# Patient Record
Sex: Female | Born: 1975 | Race: Black or African American | Hispanic: No | Marital: Single | Smoking: Never smoker
Health system: Southern US, Community
[De-identification: ages and names within clinical notes are randomized; demographics above are authoritative.]

## PROBLEM LIST (undated history)

## (undated) DIAGNOSIS — J45909 Unspecified asthma, uncomplicated: Secondary | ICD-10-CM

## (undated) HISTORY — PX: BREAST SURGERY: SHX581

---

## 2008-06-15 ENCOUNTER — Emergency Department (HOSPITAL_COMMUNITY): Admission: EM | Admit: 2008-06-15 | Discharge: 2008-06-15 | Payer: Self-pay | Admitting: Emergency Medicine

## 2011-04-19 ENCOUNTER — Emergency Department (HOSPITAL_COMMUNITY): Payer: Self-pay

## 2011-04-19 ENCOUNTER — Emergency Department (HOSPITAL_COMMUNITY)
Admission: EM | Admit: 2011-04-19 | Discharge: 2011-04-19 | Disposition: A | Discharge status: Home / Self Care | Payer: Self-pay | Attending: Emergency Medicine | Admitting: Emergency Medicine

## 2011-04-19 ENCOUNTER — Encounter (HOSPITAL_COMMUNITY): Payer: Self-pay

## 2011-04-19 DIAGNOSIS — R109 Unspecified abdominal pain: Secondary | ICD-10-CM | POA: Insufficient documentation

## 2011-04-19 DIAGNOSIS — M549 Dorsalgia, unspecified: Secondary | ICD-10-CM | POA: Insufficient documentation

## 2011-04-19 LAB — CBC
HCT: 39.7 % (ref 36.0–46.0)
Hemoglobin: 13.1 g/dL (ref 12.0–15.0)
MCHC: 33 g/dL (ref 30.0–36.0)
RBC: 4.23 MIL/uL (ref 3.87–5.11)

## 2011-04-19 LAB — COMPREHENSIVE METABOLIC PANEL
ALT: 8 U/L (ref 0–35)
AST: 18 U/L (ref 0–37)
Alkaline Phosphatase: 46 U/L (ref 39–117)
CO2: 25 mEq/L (ref 19–32)
Calcium: 9.9 mg/dL (ref 8.4–10.5)
Chloride: 103 mEq/L (ref 96–112)
GFR calc Af Amer: >60 mL/min (ref >60)
GFR calc non Af Amer: >60 mL/min (ref >60)
Glucose, Bld: 78 mg/dL (ref 70–99)
Potassium: 3.4 mEq/L — ABNORMAL LOW (ref 3.5–5.1)
Sodium: 139 mEq/L (ref 135–145)
Total Bilirubin: 0.4 mg/dL (ref 0.3–1.2)

## 2011-04-19 LAB — URINALYSIS, ROUTINE W REFLEX MICROSCOPIC
Bilirubin Urine: NEGATIVE
Nitrite: NEGATIVE
Urobilinogen, UA: 0.2 mg/dL (ref 0.0–1.0)
pH: 5.5 (ref 5.0–8.0)

## 2011-04-19 LAB — DIFFERENTIAL
Basophils Absolute: 0 10*3/uL (ref 0.0–0.1)
Lymphocytes Relative: 36 % (ref 12–46)
Monocytes Absolute: 0.5 10*3/uL (ref 0.1–1.0)
Neutro Abs: 2.8 10*3/uL (ref 1.7–7.7)
Neutrophils Relative %: 51 % (ref 43–77)

## 2011-04-19 LAB — URINE MICROSCOPIC-ADD ON

## 2011-04-19 LAB — PREGNANCY, URINE: Preg Test, Ur: NEGATIVE

## 2011-04-26 ENCOUNTER — Emergency Department (HOSPITAL_COMMUNITY)
Admission: EM | Admit: 2011-04-26 | Discharge: 2011-04-26 | Disposition: A | Discharge status: Home / Self Care | Payer: Non-veteran care | Attending: Emergency Medicine | Admitting: Emergency Medicine

## 2011-04-26 DIAGNOSIS — N760 Acute vaginitis: Secondary | ICD-10-CM | POA: Insufficient documentation

## 2011-04-26 DIAGNOSIS — B9689 Other specified bacterial agents as the cause of diseases classified elsewhere: Secondary | ICD-10-CM | POA: Insufficient documentation

## 2011-04-26 DIAGNOSIS — N739 Female pelvic inflammatory disease, unspecified: Secondary | ICD-10-CM | POA: Insufficient documentation

## 2011-04-26 DIAGNOSIS — A499 Bacterial infection, unspecified: Secondary | ICD-10-CM | POA: Insufficient documentation

## 2011-04-26 LAB — WET PREP, GENITAL
Trich, Wet Prep: NONE SEEN
Yeast Wet Prep HPF POC: NONE SEEN

## 2011-04-26 LAB — URINALYSIS, ROUTINE W REFLEX MICROSCOPIC
Glucose, UA: NEGATIVE mg/dL
Protein, ur: NEGATIVE mg/dL
Specific Gravity, Urine: 1.031 — ABNORMAL HIGH (ref 1.005–1.030)
pH: 6 (ref 5.0–8.0)

## 2011-04-26 LAB — POCT PREGNANCY, URINE: Preg Test, Ur: NEGATIVE

## 2011-09-05 LAB — RPR: RPR Ser Ql: NONREACTIVE

## 2011-09-05 LAB — URINALYSIS, ROUTINE W REFLEX MICROSCOPIC
Ketones, ur: NEGATIVE
Nitrite: NEGATIVE
pH: 6

## 2011-09-05 LAB — WET PREP, GENITAL: Clue Cells Wet Prep HPF POC: NONE SEEN

## 2011-09-05 LAB — POCT PREGNANCY, URINE: Operator id: 272551

## 2012-11-06 ENCOUNTER — Emergency Department (HOSPITAL_COMMUNITY)
Admission: EM | Admit: 2012-11-06 | Discharge: 2012-11-07 | Disposition: A | Discharge status: Home / Self Care | Payer: Non-veteran care | Attending: Emergency Medicine | Admitting: Emergency Medicine

## 2012-11-06 ENCOUNTER — Encounter (HOSPITAL_COMMUNITY): Payer: Self-pay | Admitting: Emergency Medicine

## 2012-11-06 DIAGNOSIS — N760 Acute vaginitis: Secondary | ICD-10-CM | POA: Insufficient documentation

## 2012-11-06 DIAGNOSIS — B9689 Other specified bacterial agents as the cause of diseases classified elsewhere: Secondary | ICD-10-CM

## 2012-11-06 DIAGNOSIS — Z79899 Other long term (current) drug therapy: Secondary | ICD-10-CM | POA: Insufficient documentation

## 2012-11-06 DIAGNOSIS — J45909 Unspecified asthma, uncomplicated: Secondary | ICD-10-CM | POA: Insufficient documentation

## 2012-11-06 HISTORY — DX: Unspecified asthma, uncomplicated: J45.909

## 2012-11-06 LAB — URINALYSIS, ROUTINE W REFLEX MICROSCOPIC
Bilirubin Urine: NEGATIVE
Hgb urine dipstick: NEGATIVE
Specific Gravity, Urine: 1.016 (ref 1.005–1.030)
Urobilinogen, UA: 0.2 mg/dL (ref 0.0–1.0)
pH: 5.5 (ref 5.0–8.0)

## 2012-11-06 LAB — CBC WITH DIFFERENTIAL/PLATELET
Eosinophils Absolute: 0 10*3/uL (ref 0.0–0.7)
Eosinophils Relative: 1 % (ref 0–5)
HCT: 39 % (ref 36.0–46.0)
Hemoglobin: 13.5 g/dL (ref 12.0–15.0)
Lymphs Abs: 1.2 10*3/uL (ref 0.7–4.0)
MCH: 32.3 pg (ref 26.0–34.0)
MCV: 93.3 fL (ref 78.0–100.0)
Monocytes Absolute: 0.2 10*3/uL (ref 0.1–1.0)
Monocytes Relative: 5 % (ref 3–12)
RBC: 4.18 MIL/uL (ref 3.87–5.11)

## 2012-11-06 LAB — COMPREHENSIVE METABOLIC PANEL
Alkaline Phosphatase: 46 U/L (ref 39–117)
BUN: 12 mg/dL (ref 6–23)
Calcium: 9.4 mg/dL (ref 8.4–10.5)
GFR calc Af Amer: >90 mL/min (ref >90)
GFR calc non Af Amer: 89 mL/min — ABNORMAL LOW (ref >90)
Glucose, Bld: 86 mg/dL (ref 70–99)
Total Protein: 6.7 g/dL (ref 6.0–8.3)

## 2012-11-06 LAB — LIPASE, BLOOD: Lipase: 17 U/L (ref 11–59)

## 2012-11-06 LAB — WET PREP, GENITAL: WBC, Wet Prep HPF POC: NONE SEEN

## 2012-11-06 LAB — POCT PREGNANCY, URINE: Preg Test, Ur: NEGATIVE

## 2012-11-06 MED ORDER — ONDANSETRON HCL 4 MG/2ML IJ SOLN
4.0000 mg | Freq: Once | INTRAMUSCULAR | Status: AC
  Administered 2012-11-06: 4 mg via INTRAVENOUS
  Filled 2012-11-06: qty 2

## 2012-11-06 MED ORDER — HYDROMORPHONE HCL PF 1 MG/ML IJ SOLN
1.0000 mg | Freq: Once | INTRAMUSCULAR | Status: AC
  Administered 2012-11-06: 1 mg via INTRAVENOUS
  Filled 2012-11-06: qty 1

## 2012-11-06 MED ORDER — SODIUM CHLORIDE 0.9 % IV SOLN
1000.0000 mL | INTRAVENOUS | Status: DC
  Administered 2012-11-06: 1000 mL via INTRAVENOUS

## 2012-11-06 NOTE — ED Notes (Signed)
Pt finished with contrast, CT notified. 

## 2012-11-06 NOTE — ED Provider Notes (Signed)
History     CSN: [id number]  Arrival date & time 11/06/12  4098   First MD Initiated Contact with Patient 11/06/12 2122      Chief Complaint  Patient presents with  . Abdominal Pain    (Consider location/radiation/quality/duration/timing/severity/associated sxs/prior treatment) Patient is a 36 y.o. female presenting with abdominal pain. The history is provided by the patient and medical records. No language interpreter was used.  Abdominal Pain The primary symptoms of the illness include abdominal pain. The primary symptoms of the illness do not include fever, nausea, vomiting, diarrhea, dysuria, vaginal discharge or vaginal bleeding. Primary symptoms comment: Patient has had left lower quadrant abdominal pain on and off for over a year. She seen the doctor a couple of times. Review of old charts shows prior diagnoses of bacterial vaginosis. Today the pain became much worse, radiating into her left thigh. The current episode started 6 to 12 hours ago. The onset of the illness was sudden. The problem has been rapidly worsening.  Associated with: Nothing. The patient states that she believes she is currently not pregnant. The patient has not had a change in bowel habit. Risk factors: Prior C-section. Symptoms associated with the illness do not include chills, urgency, hematuria or frequency. Associated medical issues comments: None..    Past Medical History  Diagnosis Date  . Asthma     Past Surgical History  Procedure Date  . Cesarean section   . Breast surgery     No family history on file.  History  Substance Use Topics  . Smoking status: Never Smoker   . Smokeless tobacco: Not on file  . Alcohol Use: Yes    OB History    Grav Para Term Preterm Abortions TAB SAB Ect Mult Living                  Review of Systems  Constitutional: Negative.  Negative for fever and chills.  HENT: Negative.   Eyes: Negative.   Respiratory: Negative.   Cardiovascular: Negative.     Gastrointestinal: Positive for abdominal pain. Negative for nausea, vomiting and diarrhea.  Genitourinary: Negative for dysuria, urgency, frequency, hematuria, vaginal bleeding and vaginal discharge.  Musculoskeletal: Negative.   Skin: Negative.   Neurological: Negative.   Psychiatric/Behavioral: Negative.     Allergies  Review of patient's allergies indicates no known allergies.  Home Medications   Current Outpatient Rx  Name  Route  Sig  Dispense  Refill  . ARIPIPRAZOLE 5 MG PO TABS   Oral   Take 5 mg by mouth daily.         . ASPIRIN 81 MG PO CHEW   Oral   Chew 81 mg by mouth daily.           BP 94/65  Pulse 71  Temp 98.6 F (37 C) (Oral)  Resp 20  SpO2 100%  LMP 10/15/2012  Physical Exam  Nursing note and vitals reviewed. Constitutional: She is oriented to person, place, and time. She appears well-developed and well-nourished.       In moderate distress with LLQ pain.  HENT:  Head: Normocephalic and atraumatic.  Right Ear: External ear normal.  Left Ear: External ear normal.  Mouth/Throat: Oropharynx is clear and moist.  Eyes: Conjunctivae normal and EOM are normal. Pupils are equal, round, and reactive to light.  Neck: Normal range of motion. Neck supple. No thyromegaly present.  Cardiovascular: Normal rate, regular rhythm and normal heart sounds.   Pulmonary/Chest: Effort normal and breath  sounds normal.  Abdominal: Soft.       LLQ tenderness, no rebound, mass or rigidity.  Genitourinary:       Pelvic exam showed normal female external genitalia.  Speculum exam showed minimal gray discharge.  Specimens sent for GC/chlamydia and wet prep.  Bimanual exam showed no uterine or adnexal mass or tenderness.  Musculoskeletal: Normal range of motion. She exhibits no edema and no tenderness.  Lymphadenopathy:    She has no cervical adenopathy.  Neurological: She is alert and oriented to person, place, and time.       No sensory or motor deficit.  Skin: Skin is  warm and dry.  Psychiatric: She has a normal mood and affect. Her behavior is normal.    ED Course  Procedures (including critical care time)  Labs Reviewed  COMPREHENSIVE METABOLIC PANEL - Abnormal; Notable for the following:    GFR calc non Af Amer 89 (*)     All other components within normal limits  CBC WITH DIFFERENTIAL  POCT PREGNANCY, URINE  URINALYSIS, ROUTINE W REFLEX MICROSCOPIC  COMPREHENSIVE METABOLIC PANEL  LIPASE, BLOOD  URINALYSIS, ROUTINE W REFLEX MICROSCOPIC  PREGNANCY, URINE  CBC WITH DIFFERENTIAL  WET PREP, GENITAL  GC/CHLAMYDIA PROBE AMP   9:52 PM Pt seen --> physical exam performed.  Lab workup ordered.  IV fluids, IV medications for pain and nausea ordered.  12:26 AM Results for orders placed during the hospital encounter of 11/06/12  URINALYSIS, ROUTINE W REFLEX MICROSCOPIC      Component Value Range   Color, Urine YELLOW  YELLOW   APPearance CLOUDY (*) CLEAR   Specific Gravity, Urine 1.016  1.005 - 1.030   pH 5.5  5.0 - 8.0   Glucose, UA NEGATIVE  NEGATIVE mg/dL   Hgb urine dipstick NEGATIVE  NEGATIVE   Bilirubin Urine NEGATIVE  NEGATIVE   Ketones, ur NEGATIVE  NEGATIVE mg/dL   Protein, ur NEGATIVE  NEGATIVE mg/dL   Urobilinogen, UA 0.2  0.0 - 1.0 mg/dL   Nitrite NEGATIVE  NEGATIVE   Leukocytes, UA NEGATIVE  NEGATIVE  CBC WITH DIFFERENTIAL      Component Value Range   WBC 4.1  4.0 - 10.5 K/uL   RBC 4.18  3.87 - 5.11 MIL/uL   Hemoglobin 13.5  12.0 - 15.0 g/dL   HCT 16.1  09.6 - 04.5 %   MCV 93.3  78.0 - 100.0 fL   MCH 32.3  26.0 - 34.0 pg   MCHC 34.6  30.0 - 36.0 g/dL   RDW 40.9  81.1 - 91.4 %   Platelets 182  150 - 400 K/uL   Neutrophils Relative 64  43 - 77 %   Neutro Abs 2.7  1.7 - 7.7 K/uL   Lymphocytes Relative 29  12 - 46 %   Lymphs Abs 1.2  0.7 - 4.0 K/uL   Monocytes Relative 5  3 - 12 %   Monocytes Absolute 0.2  0.1 - 1.0 K/uL   Eosinophils Relative 1  0 - 5 %   Eosinophils Absolute 0.0  0.0 - 0.7 K/uL   Basophils Relative 1   0 - 1 %   Basophils Absolute 0.0  0.0 - 0.1 K/uL  COMPREHENSIVE METABOLIC PANEL      Component Value Range   Sodium 136  135 - 145 mEq/L   Potassium 4.3  3.5 - 5.1 mEq/L   Chloride 102  96 - 112 mEq/L   CO2 25  19 - 32 mEq/L  Glucose, Bld 86  70 - 99 mg/dL   BUN 12  6 - 23 mg/dL   Creatinine, Ser 2.95  0.50 - 1.10 mg/dL   Calcium 9.4  8.4 - 28.4 mg/dL   Total Protein 6.7  6.0 - 8.3 g/dL   Albumin 3.7  3.5 - 5.2 g/dL   AST 17  0 - 37 U/L   ALT 7  0 - 35 U/L   Alkaline Phosphatase 46  39 - 117 U/L   Total Bilirubin 0.4  0.3 - 1.2 mg/dL   GFR calc non Af Amer 89 (*) >90 mL/min   GFR calc Af Amer >90  >90 mL/min  POCT PREGNANCY, URINE      Component Value Range   Preg Test, Ur NEGATIVE  NEGATIVE  LIPASE, BLOOD      Component Value Range   Lipase 17  11 - 59 U/L  WET PREP, GENITAL      Component Value Range   Yeast Wet Prep HPF POC NONE SEEN  NONE SEEN   Trich, Wet Prep NONE SEEN  NONE SEEN   Clue Cells Wet Prep HPF POC FEW (*) NONE SEEN   WBC, Wet Prep HPF POC NONE SEEN  NONE SEEN   Lab tests showed a few clue cells on wet prep.  CBC, chemistries, lipase, UA all negative.  Waiting for CT of abdomen/pelvis.  12:42 AM CT abdomen and pelvis are negative.  Will Rx for Bacterial Vaginosis.    1. Bacterial vaginosis             Carleene Cooper III, MD 11/07/12 [phone]

## 2012-11-06 NOTE — ED Notes (Signed)
PT. REPORTS LEFT LOWER ABDOMEN/LEFT GROIN PAIN FOR 1 YEAR WORSE TODAY , ETIOLOGY UNKNOWN , DENIES NAUSEA / VOMITTING OR DIARRHE A, NO FEVER OR CHILLS , NO INJURY OR VAGINAL DISCHARGE.

## 2012-11-07 ENCOUNTER — Emergency Department (HOSPITAL_COMMUNITY): Payer: Non-veteran care

## 2012-11-07 MED ORDER — HYDROCODONE-ACETAMINOPHEN 5-325 MG PO TABS: 1.0000 | ORAL_TABLET | ORAL | Status: DC | PRN

## 2012-11-07 MED ORDER — IOHEXOL 300 MG/ML  SOLN
100.0000 mL | Freq: Once | INTRAMUSCULAR | Status: AC | PRN
  Administered 2012-11-07: 100 mL via INTRAVENOUS

## 2012-11-07 MED ORDER — HYDROCODONE-ACETAMINOPHEN 5-325 MG PO TABS
1.0000 | ORAL_TABLET | Freq: Once | ORAL | Status: AC
  Administered 2012-11-07: 1 via ORAL
  Filled 2012-11-07: qty 1

## 2012-11-07 MED ORDER — METRONIDAZOLE 500 MG PO TABS: 500.0000 mg | ORAL_TABLET | Freq: Two times a day (BID) | ORAL | Status: DC

## 2012-11-08 LAB — GC/CHLAMYDIA PROBE AMP: CT Probe RNA: NEGATIVE

## 2013-10-12 ENCOUNTER — Emergency Department (HOSPITAL_COMMUNITY)
Admission: EM | Admit: 2013-10-12 | Discharge: 2013-10-13 | Disposition: A | Discharge status: Home / Self Care | Payer: Non-veteran care | Attending: Emergency Medicine | Admitting: Emergency Medicine

## 2013-10-12 ENCOUNTER — Encounter (HOSPITAL_COMMUNITY): Payer: Self-pay | Admitting: Emergency Medicine

## 2013-10-12 DIAGNOSIS — M545 Low back pain, unspecified: Secondary | ICD-10-CM | POA: Insufficient documentation

## 2013-10-12 DIAGNOSIS — N898 Other specified noninflammatory disorders of vagina: Secondary | ICD-10-CM | POA: Insufficient documentation

## 2013-10-12 DIAGNOSIS — R350 Frequency of micturition: Secondary | ICD-10-CM | POA: Insufficient documentation

## 2013-10-12 DIAGNOSIS — R3915 Urgency of urination: Secondary | ICD-10-CM | POA: Insufficient documentation

## 2013-10-12 DIAGNOSIS — R109 Unspecified abdominal pain: Secondary | ICD-10-CM

## 2013-10-12 DIAGNOSIS — Z3202 Encounter for pregnancy test, result negative: Secondary | ICD-10-CM | POA: Insufficient documentation

## 2013-10-12 DIAGNOSIS — J45909 Unspecified asthma, uncomplicated: Secondary | ICD-10-CM | POA: Insufficient documentation

## 2013-10-12 DIAGNOSIS — Z79899 Other long term (current) drug therapy: Secondary | ICD-10-CM | POA: Insufficient documentation

## 2013-10-12 DIAGNOSIS — R319 Hematuria, unspecified: Secondary | ICD-10-CM | POA: Insufficient documentation

## 2013-10-12 DIAGNOSIS — N949 Unspecified condition associated with female genital organs and menstrual cycle: Secondary | ICD-10-CM | POA: Insufficient documentation

## 2013-10-12 DIAGNOSIS — R102 Pelvic and perineal pain: Secondary | ICD-10-CM

## 2013-10-12 LAB — CBC
Platelets: 202 10*3/uL (ref 150–400)
RDW: 12.3 % (ref 11.5–15.5)
WBC: 5.5 10*3/uL (ref 4.0–10.5)

## 2013-10-12 LAB — URINALYSIS, ROUTINE W REFLEX MICROSCOPIC
Glucose, UA: NEGATIVE mg/dL
Nitrite: NEGATIVE
Protein, ur: NEGATIVE mg/dL
Urobilinogen, UA: 0.2 mg/dL (ref 0.0–1.0)

## 2013-10-12 LAB — BASIC METABOLIC PANEL
Chloride: 101 mEq/L (ref 96–112)
GFR calc Af Amer: 88 mL/min — ABNORMAL LOW (ref >90)
Potassium: 3.7 mEq/L (ref 3.5–5.1)
Sodium: 133 mEq/L — ABNORMAL LOW (ref 135–145)

## 2013-10-12 LAB — POCT PREGNANCY, URINE: Preg Test, Ur: NEGATIVE

## 2013-10-12 MED ORDER — FENTANYL CITRATE 0.05 MG/ML IJ SOLN
50.0000 ug | INTRAMUSCULAR | Status: DC | PRN
  Administered 2013-10-13: 50 ug via INTRAVENOUS
  Filled 2013-10-12: qty 2

## 2013-10-12 MED ORDER — ONDANSETRON HCL 4 MG/2ML IJ SOLN
4.0000 mg | Freq: Once | INTRAMUSCULAR | Status: AC
  Administered 2013-10-13: 4 mg via INTRAVENOUS
  Filled 2013-10-12: qty 2

## 2013-10-12 NOTE — ED Notes (Signed)
Pt states she thinks she has a UTI  Pt states her symptoms started last Wednesday and has progressively gotten worse  Pt states for the past 3 days she has had pain in her left flank area

## 2013-10-13 ENCOUNTER — Emergency Department (HOSPITAL_COMMUNITY): Payer: Non-veteran care

## 2013-10-13 LAB — WET PREP, GENITAL: Trich, Wet Prep: NONE SEEN

## 2013-10-13 MED ORDER — ONDANSETRON HCL 4 MG PO TABS: 4.0000 mg | ORAL_TABLET | Freq: Four times a day (QID) | ORAL | Status: AC

## 2013-10-13 MED ORDER — FENTANYL CITRATE 0.05 MG/ML IJ SOLN
50.0000 ug | Freq: Once | INTRAMUSCULAR | Status: AC
  Administered 2013-10-13: 50 ug via INTRAVENOUS
  Filled 2013-10-13: qty 2

## 2013-10-13 MED ORDER — OXYCODONE-ACETAMINOPHEN 5-325 MG PO TABS: 1.0000 | ORAL_TABLET | Freq: Four times a day (QID) | ORAL | Status: AC | PRN

## 2013-10-13 NOTE — ED Notes (Signed)
US at bedside

## 2013-10-13 NOTE — ED Provider Notes (Signed)
Medical screening examination/treatment/procedure(s) were performed by non-physician practitioner and as supervising physician I was immediately available for consultation/collaboration.    Rosalina Dingwall M Aayden Cefalu, MD 10/13/13 0524 

## 2013-10-13 NOTE — ED Provider Notes (Signed)
CSN: [id number]     Arrival date & time 10/12/13  2224 History   First MD Initiated Contact with Patient 10/12/13 2259     Chief Complaint  Patient presents with  . Flank Pain   (Consider location/radiation/quality/duration/timing/severity/associated sxs/prior Treatment) Patient is a 37 y.o. female presenting with flank pain. The history is provided by the patient and medical records. No language interpreter was used.  Flank Pain Associated symptoms include abdominal pain. Pertinent negatives include no chest pain, congestion, diaphoresis, fatigue, fever, headaches, nausea, rash, sore throat or vomiting.    Beverly Bowman is a 37 y.o. female  with a hx of asthma presents to the Emergency Department complaining of gradual, persistent, progressively worsening left lower pelvic pain onset 6 days ago. She reports her symptoms began initially with dysuria, hematuria, urgency and frequency for 3 days ago she began to have pain in the left flank, bilateral lower back and left pelvic region.  She reports about the same time her pain increased for dysuria and hematuria resolved.  She reports she thinks she has a UTI.  Nothing makes her pain better or worse. She is attempted Azo and garlic.  She denies fever, chills, headache, neck pain, chest pain, shortness of breath no nausea, vomiting, diarrhea, constipation, weakness, dizziness, syncope.    Past Medical History  Diagnosis Date  . Asthma    Past Surgical History  Procedure Laterality Date  . Cesarean section    . Breast surgery     Family History  Problem Relation Age of Onset  . Hypertension Mother   . CAD Other    History  Substance Use Topics  . Smoking status: Never Smoker   . Smokeless tobacco: Not on file  . Alcohol Use: Yes   OB History   Grav Para Term Preterm Abortions TAB SAB Ect Mult Living                 Review of Systems  Constitutional: Negative for fever, diaphoresis, appetite change and fatigue.  HENT: Negative  for congestion and sore throat.   Respiratory: Negative for shortness of breath.   Cardiovascular: Negative for chest pain.  Gastrointestinal: Positive for abdominal pain. Negative for nausea, vomiting, diarrhea, constipation and blood in stool.  Endocrine: Negative for polydipsia, polyphagia and polyuria.  Genitourinary: Positive for dysuria, urgency, frequency, hematuria, flank pain and pelvic pain. Negative for vaginal bleeding, vaginal discharge and difficulty urinating.  Musculoskeletal: Negative for back pain.  Skin: Negative for rash.  Allergic/Immunologic: Negative for immunocompromised state.  Neurological: Negative for headaches.  Hematological: Does not bruise/bleed easily.  Psychiatric/Behavioral: The patient is not nervous/anxious.   All other systems reviewed and are negative.    Allergies  Review of patient's allergies indicates no known allergies.  Home Medications   Current Outpatient Rx  Name  Route  Sig  Dispense  Refill  . Aspirin-Salicylamide-Caffeine (BC HEADACHE POWDER PO)   Oral   Take 1 packet by mouth daily as needed. For pain         . Cranberry-Vitamin C-Probiotic (AZO CRANBERRY PO)   Oral   Take 1 tablet by mouth 2 (two) times daily.           BP 96/56  Pulse 96  Temp(Src) 98.3 F (36.8 C) (Oral)  Resp 18  SpO2 99% Physical Exam  Nursing note and vitals reviewed. Constitutional: She is oriented to person, place, and time. She appears well-developed and well-nourished. No distress.  Awake, alert, nontoxic appearance  HENT:  Head: Normocephalic and atraumatic.  Mouth/Throat: Oropharynx is clear and moist. No oropharyngeal exudate.  Eyes: Conjunctivae are normal. No scleral icterus.  Neck: Normal range of motion. Neck supple.  Cardiovascular: Normal rate, regular rhythm, normal heart sounds and intact distal pulses.   No murmur heard. Pulmonary/Chest: Effort normal and breath sounds normal. No respiratory distress. She has no wheezes.    Abdominal: Soft. Bowel sounds are normal. She exhibits no distension and no mass. There is no hepatosplenomegaly. There is tenderness. There is guarding. There is no rigidity, no rebound and no CVA tenderness.    Genitourinary: Uterus normal. Pelvic exam was performed with patient supine. There is no rash, tenderness or lesion on the right labia. There is no rash, tenderness or lesion on the left labia. Uterus is not deviated, not enlarged, not fixed and not tender. Cervix exhibits no motion tenderness, no discharge and no friability. Right adnexum displays no mass, no tenderness and no fullness. Left adnexum displays tenderness. Left adnexum displays no mass and no fullness. No erythema, tenderness or bleeding around the vagina. No foreign body around the vagina. No signs of injury around the vagina. Vaginal discharge (thin, white, moderate) found.  Tenderness the left adnexa without palpable mass or fullness  Musculoskeletal: Normal range of motion. She exhibits no edema.  Lymphadenopathy:    She has no cervical adenopathy.  Neurological: She is alert and oriented to person, place, and time. She exhibits normal muscle tone. Coordination normal.  Speech is clear and goal oriented Moves extremities without ataxia  Skin: Skin is warm and dry. No rash noted. She is not diaphoretic. No erythema.  Psychiatric: She has a normal mood and affect. Her behavior is normal.    ED Course  Procedures (including critical care time) Labs Review Labs Reviewed  WET PREP, GENITAL - Abnormal; Notable for the following:    Clue Cells Wet Prep HPF POC FEW (*)    WBC, Wet Prep HPF POC FEW (*)    All other components within normal limits  URINALYSIS, ROUTINE W REFLEX MICROSCOPIC - Abnormal; Notable for the following:    Leukocytes, UA TRACE (*)    All other components within normal limits  BASIC METABOLIC PANEL - Abnormal; Notable for the following:    Sodium 133 (*)    GFR calc non Af Amer 76 (*)    GFR  calc Af Amer 88 (*)    All other components within normal limits  GC/CHLAMYDIA PROBE AMP  CBC  URINE MICROSCOPIC-ADD ON  POCT PREGNANCY, URINE   Imaging Review No results found.  EKG Interpretation   None       MDM   1. Pelvic pain   2. Left flank pain      Beverly Bowman presents with left lower pelvic pain, symptoms of urinary tract infection and low back pain.  Concern for possible pyelonephritis however patient is afebrile and not tachycardic. Will get a basic labs, perform pelvic exam and evaluate UA.  12:21 AM Patient with significant tenderness to the left adnexa on exam without mass or fullness. Wet prep with only a few clue cells and white blood cells, pregnancy test negative, CBC, BMP unremarkable urinalysis with trace leukocytes, no nitrites and no bacteria. Unlikely pyelonephritis. No hemoglobin and urine; unlikely ureteral stone. Will order pelvic ultrasound.   12:58 AM Discussed with Antony Madura, PA-C who will review the images and dispo accordingly.    Dahlia Client Laiken Nohr, PA-C 10/13/13 [phone]

## 2013-10-15 NOTE — ED Notes (Signed)
Chart sent to EDP office for review.+ Chlamydia 

## 2013-10-18 IMAGING — CT CT ABD-PELV W/ CM
2 of 4 series · 17 of 46 positions shown, 19 images · IV contrast (APPLIED)
Comparison: CT of the abdomen and pelvis performed 04/19/2011

CLINICAL DATA: Intermittent left lower quadrant abdominal pain and
left groin pain.

CT ABDOMEN AND PELVIS WITH CONTRAST
TECHNIQUE: Multidetector CT imaging of the abdomen and pelvis was
performed following the standard protocol during bolus
administration of intravenous contrast.
Contrast: 100mL OMNIPAQUE IOHEXOL 300 MG/ML  SOLN

[Series 2: abd/pelv with 5.0 b31f st · axial · 0.62mm/px · z∈[-338,+86]mm · 14 of 93 slices shown, 16 images]
[im 4/93  soft-tissue]
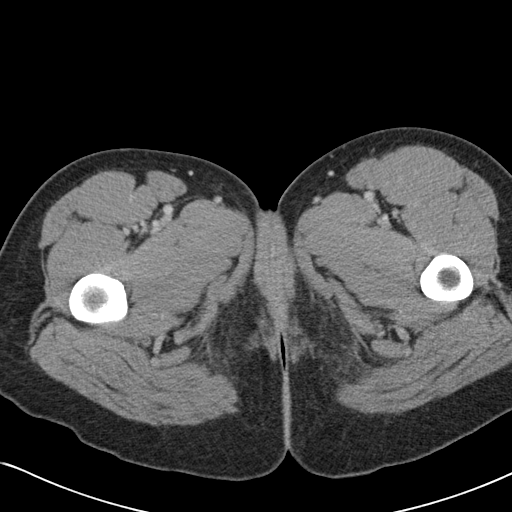
[im 4/93  bone]
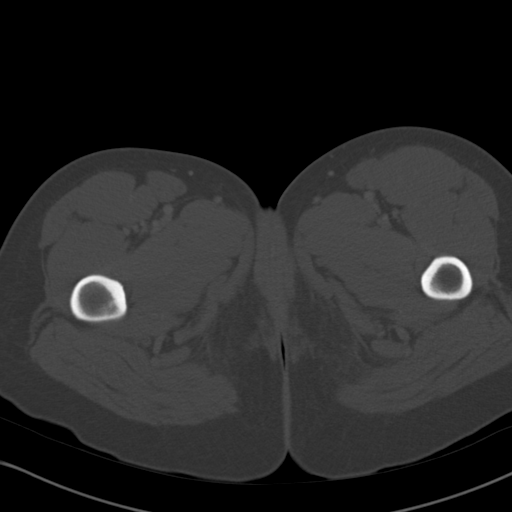
[im 11/93  soft-tissue]
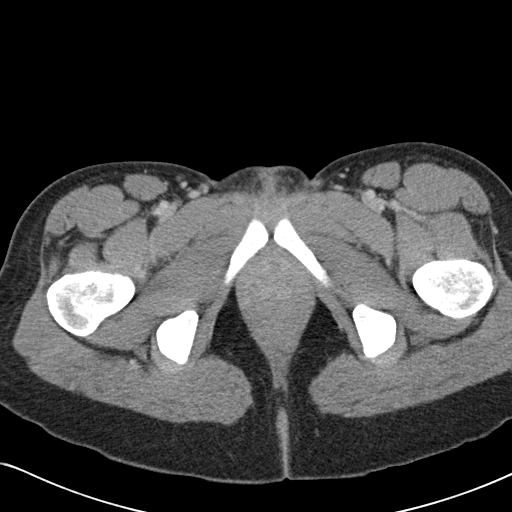
[im 18/93  soft-tissue]
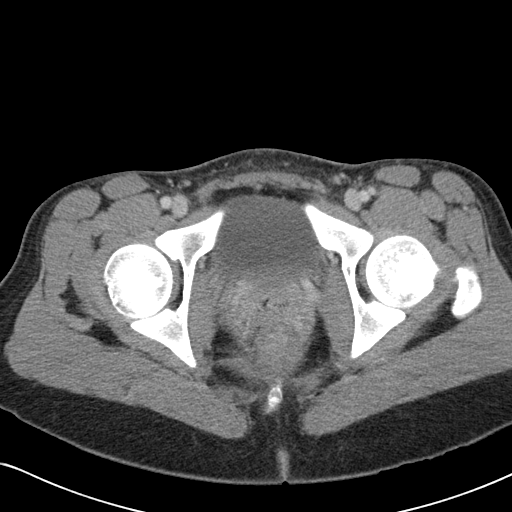
[im 24/93  soft-tissue]
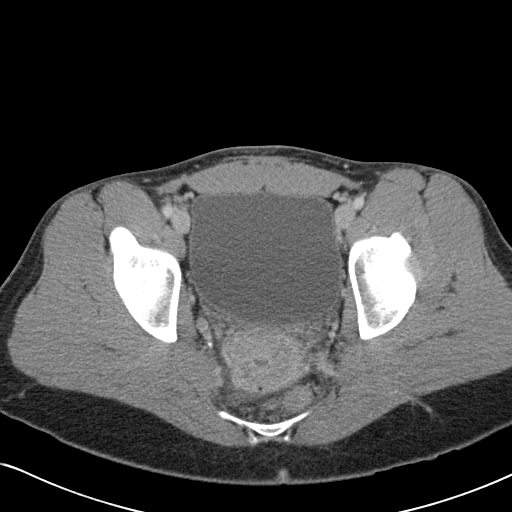
[im 31/93  soft-tissue]
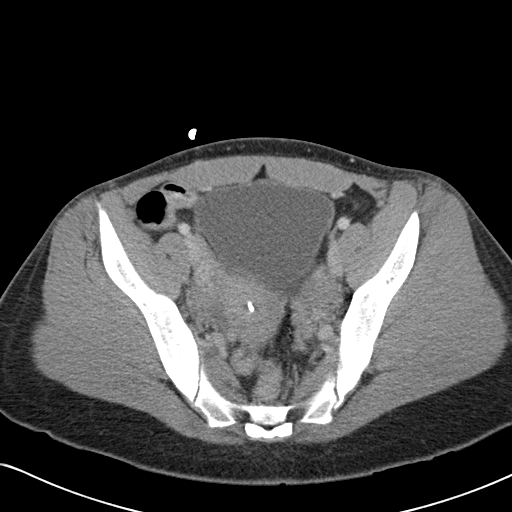
[im 38/93  soft-tissue]
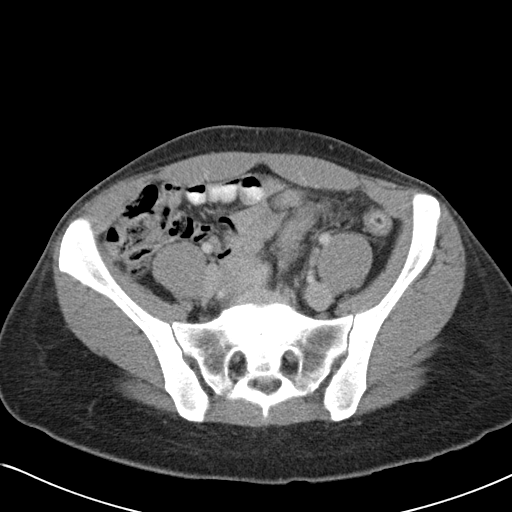
[im 45/93  soft-tissue]
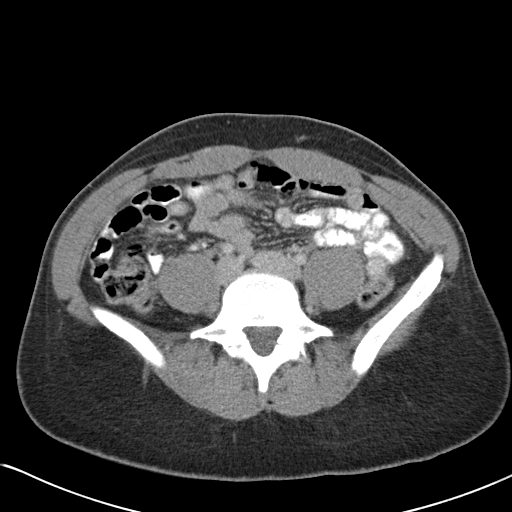
[im 48/93  soft-tissue]
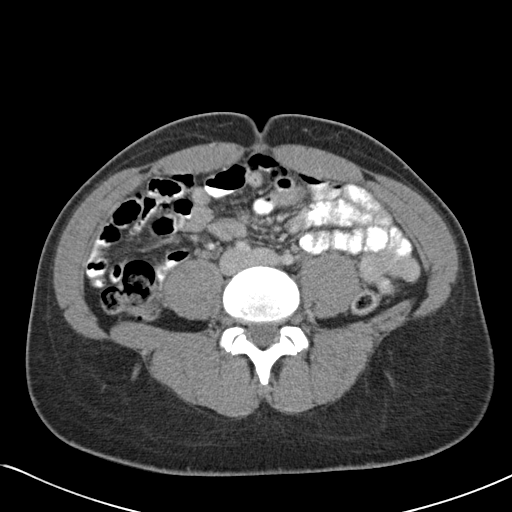
[im 55/93  soft-tissue]
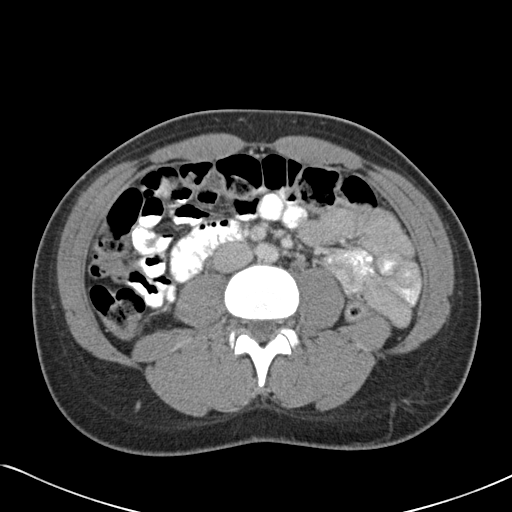
[im 55/93  bone]
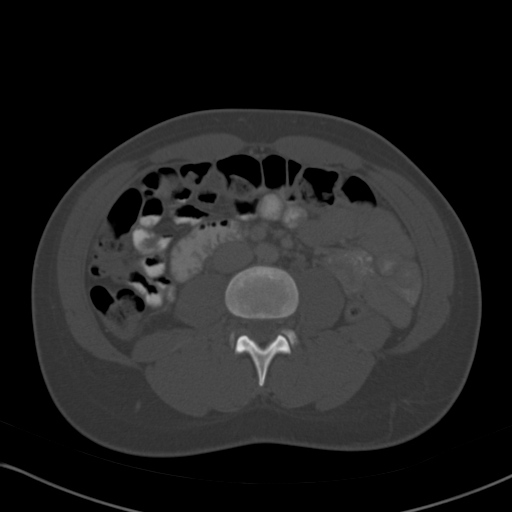
[im 62/93  soft-tissue]
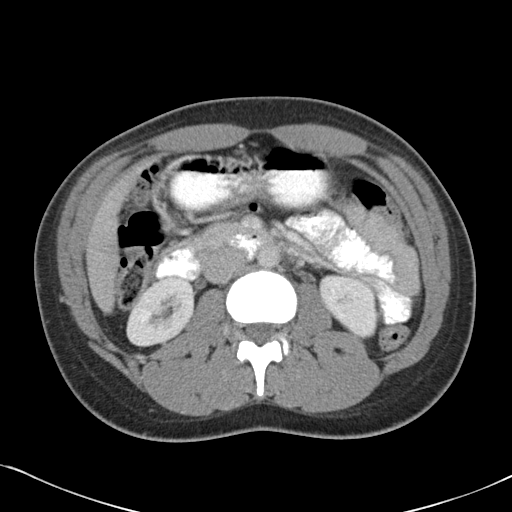
[im 69/93  soft-tissue]
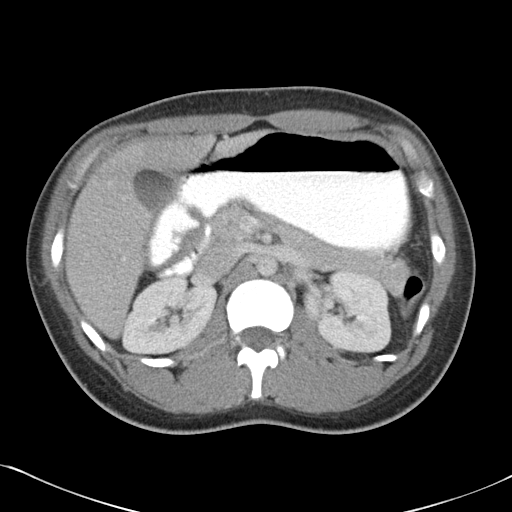
[im 75/93  soft-tissue]
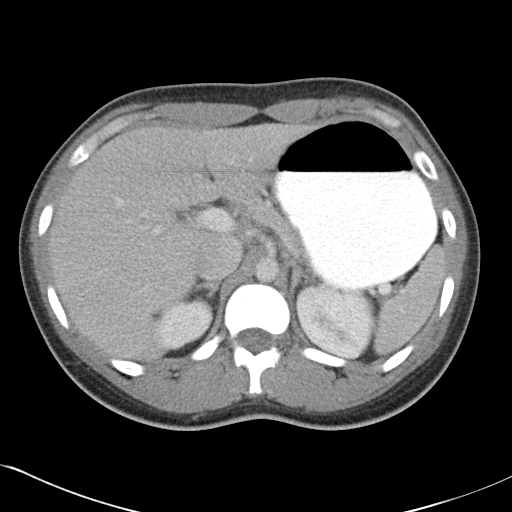
[im 82/93  soft-tissue]
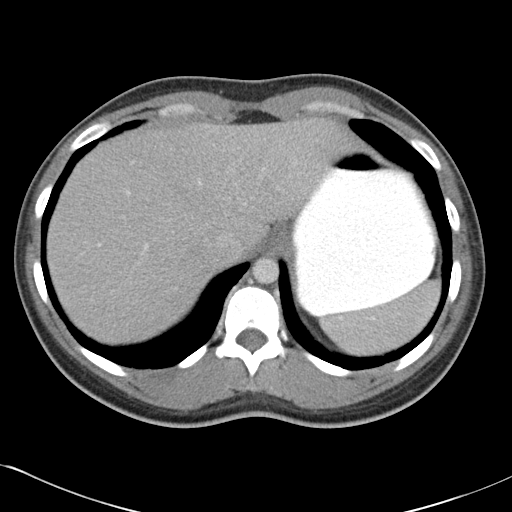
[im 89/93  soft-tissue]
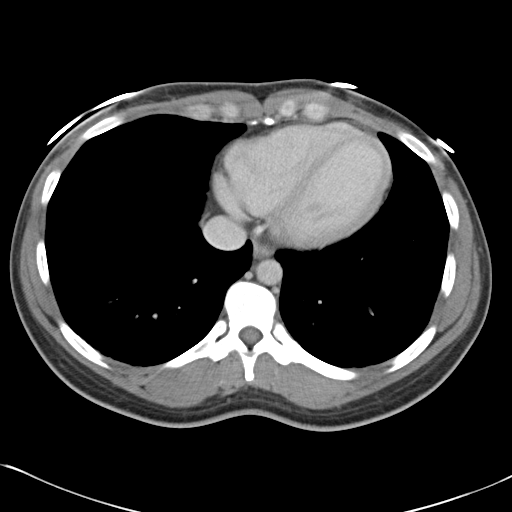

[Series 602: cor · coronal · 0.90mm/px · 3 of 97 slices shown]
[im 33/97  soft-tissue]
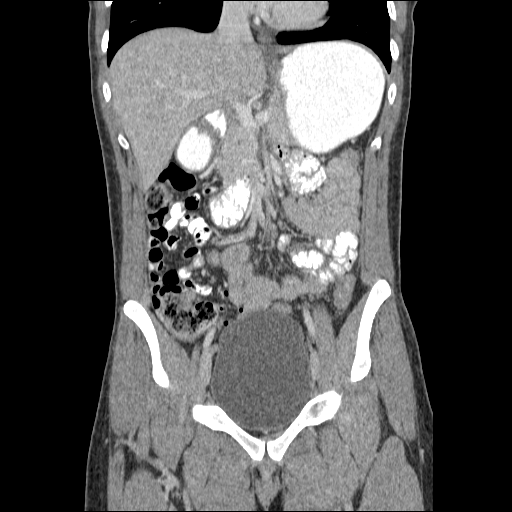
[im 43/97  soft-tissue]
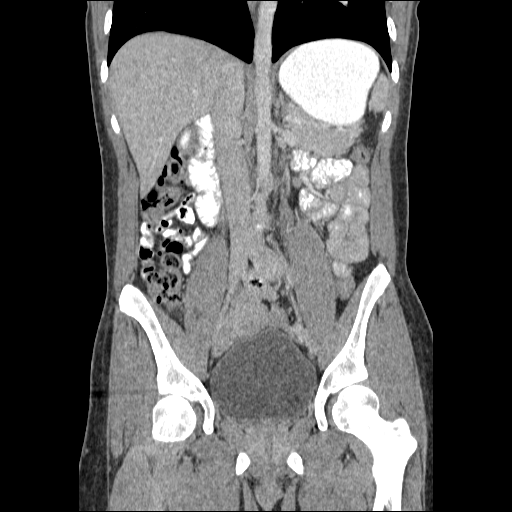
[im 54/97  soft-tissue]
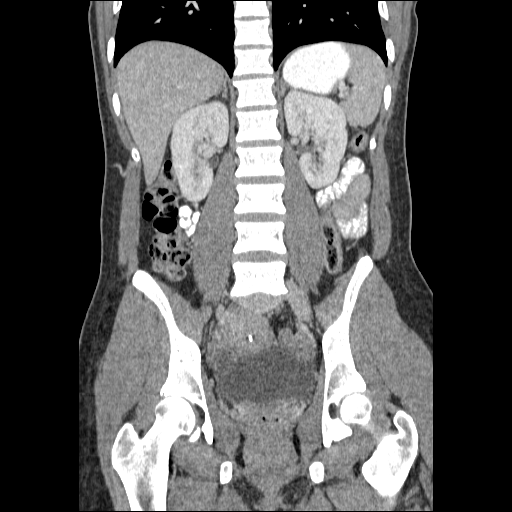

[17 of 46 positions shown; findings below may reference images not displayed]

FINDINGS: The visualized lung bases are clear.

The liver and spleen are unremarkable in appearance.  The
gallbladder is within normal limits.  The pancreas and adrenal
glands are unremarkable.

The kidneys are unremarkable in appearance.  There is no evidence
of hydronephrosis.  No renal or ureteral stones are seen.  No
perinephric stranding is appreciated.

No free fluid is identified.  The small bowel is unremarkable in
appearance.  The stomach is within normal limits.  No acute
vascular abnormalities are seen.

The appendix is normal in caliber and contains air, without
evidence for appendicitis.  The colon is partially filled with
stool and is unremarkable in appearance.

The bladder is moderately distended and grossly unremarkable.  The
uterus is unremarkable in appearance, with an intrauterine device
noted at the fundus of the uterus.  A small right ovarian cyst is
likely physiologic in nature.  The ovaries are relatively
symmetric.  No suspicious adnexal masses are seen.  No inguinal
lymphadenopathy is seen.

No acute osseous abnormalities are identified.  Chronic bilateral
pars defects are noted at L5, without significant anterolisthesis.
IMPRESSION: 1.  No acute abnormalities seen within the abdomen or pelvis.
2.  Chronic bilateral pars defects again noted at L5, without
significant anterolisthesis.

## 2013-10-25 ENCOUNTER — Telehealth (HOSPITAL_COMMUNITY): Payer: Self-pay | Admitting: Emergency Medicine

## 2013-10-25 NOTE — ED Notes (Signed)
Chart returned from EDP office. Per Santiago Glad PA-C, Azithromycin 500 mg tablet. Sig: take two tablets po x 1. #2.

## 2014-09-23 IMAGING — US US PELVIS COMPLETE
1 series · 13 of 25 positions shown · non-contrast
Comparison: No priors.

CLINICAL DATA: Left pelvic pain intermittently over the past one
and a half years.

EXAM:
TRANSABDOMINAL AND TRANSVAGINAL ULTRASOUND OF PELVIS
TECHNIQUE: Both transabdominal and transvaginal ultrasound examinations of the
pelvis were performed. Transabdominal technique was performed for
global imaging of the pelvis including uterus, ovaries, adnexal
regions, and pelvic cul-de-sac. It was necessary to proceed with
endovaginal exam following the transabdominal exam to visualize the
adnexal regions.

[Series 1: us pelvis complete · 0.24mm/px · 81 acquisitions, 13 frames shown]
[im 1/81]
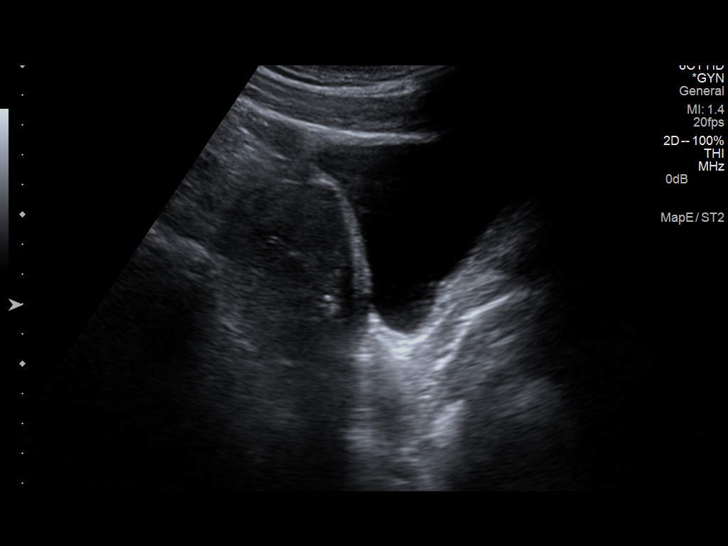
[im 7/81]
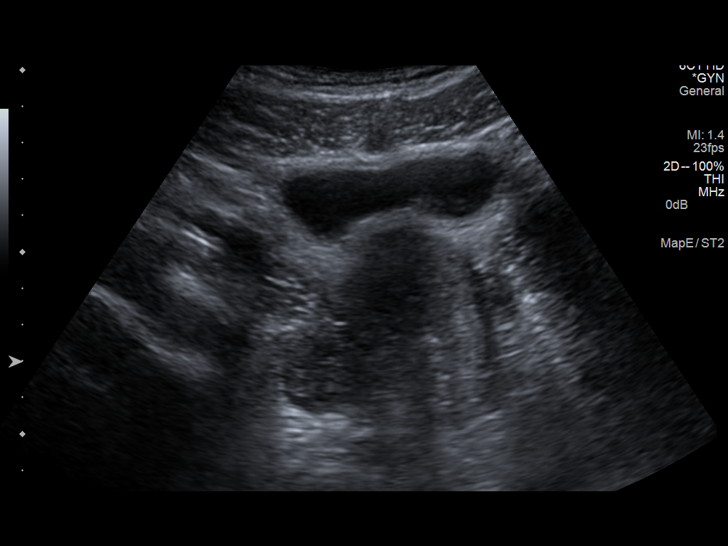
[im 14/81]
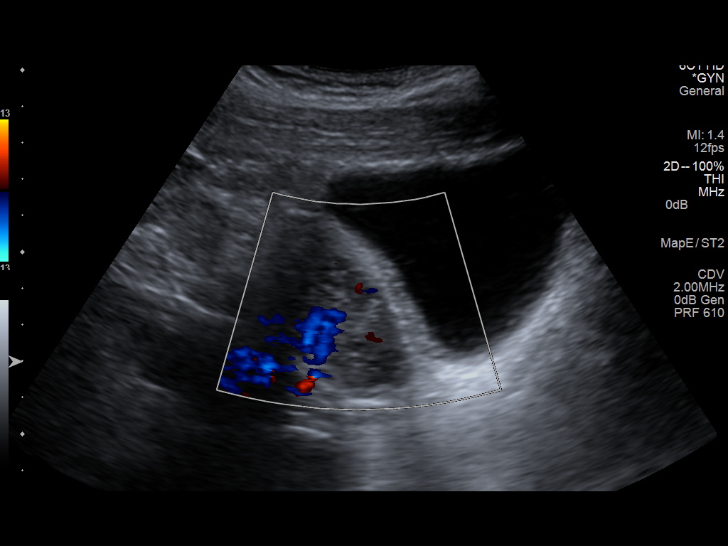
[im 21/81]
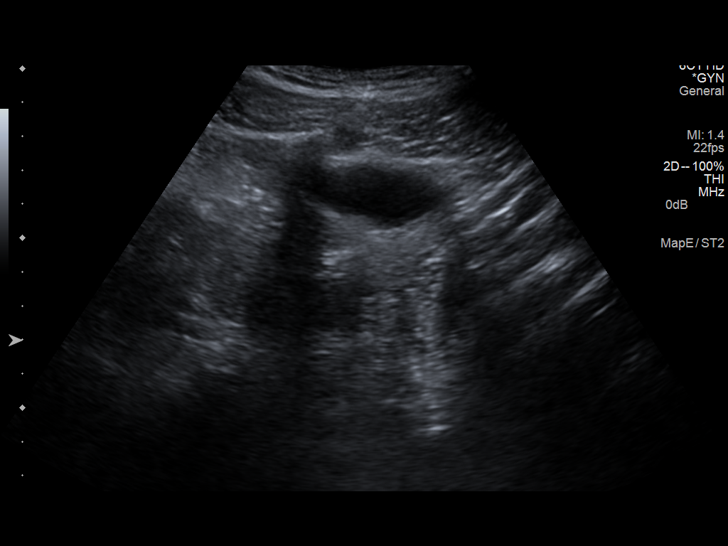
[im 27/81]
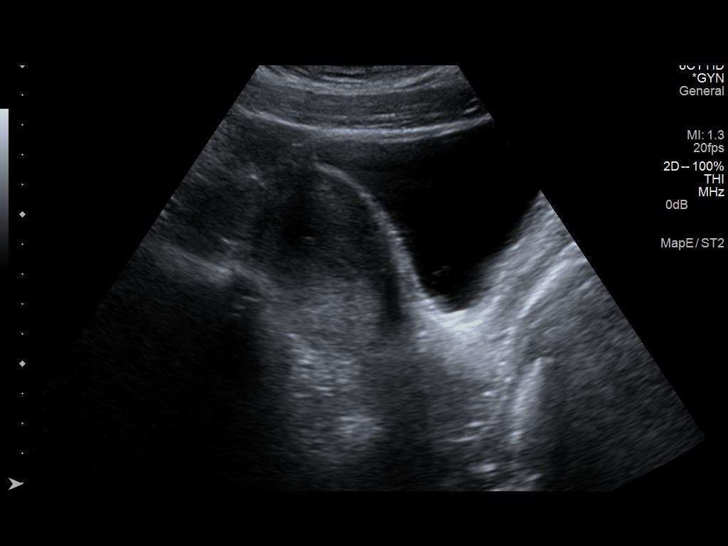
[im 34/81]
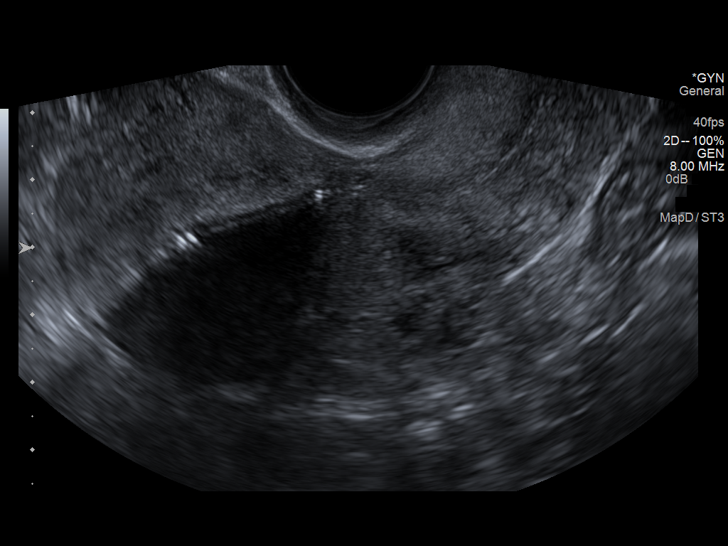
[im 41/81]
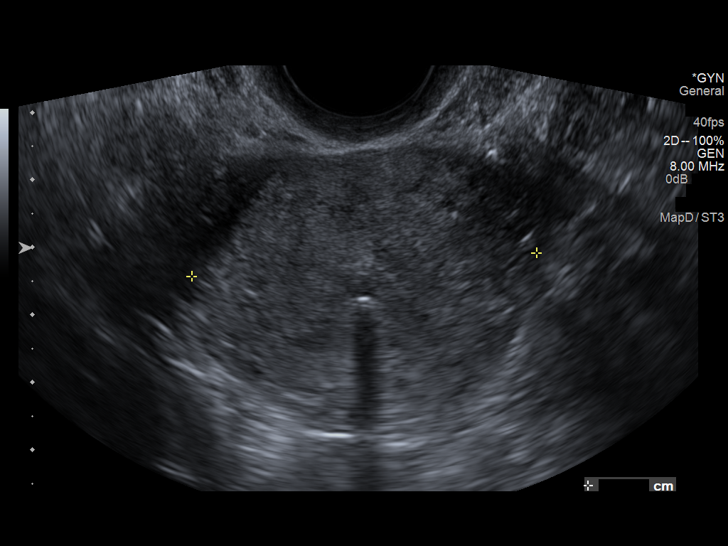
[im 47/81]
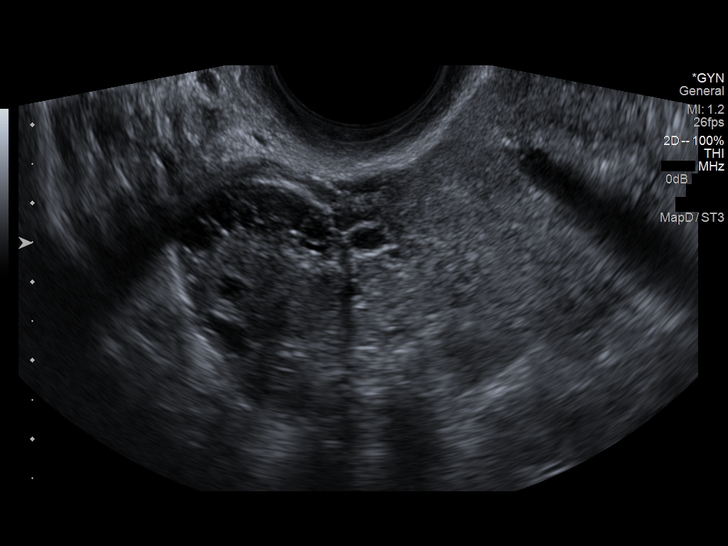
[im 54/81]
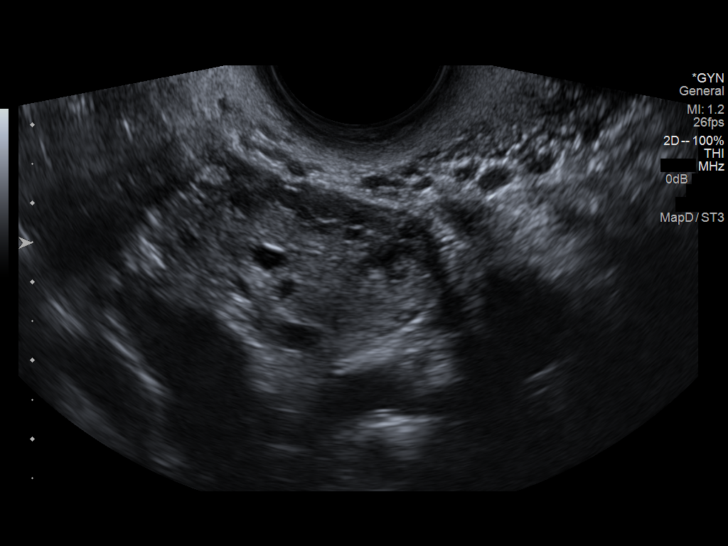
[im 61/81]
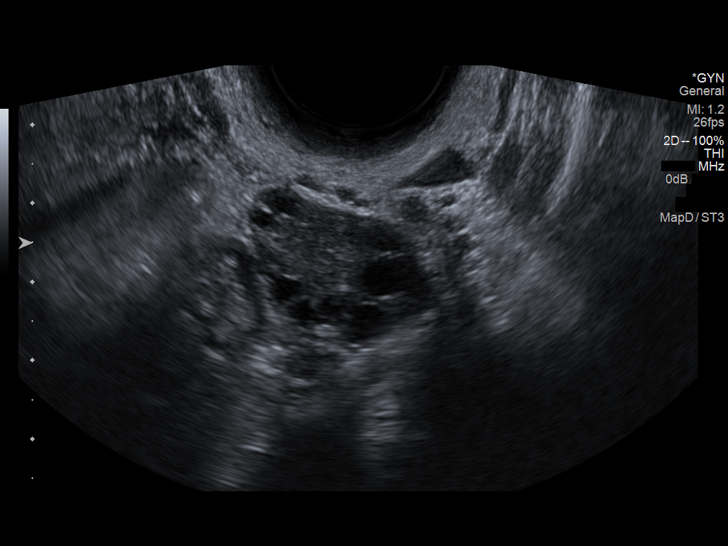
[im 67/81]
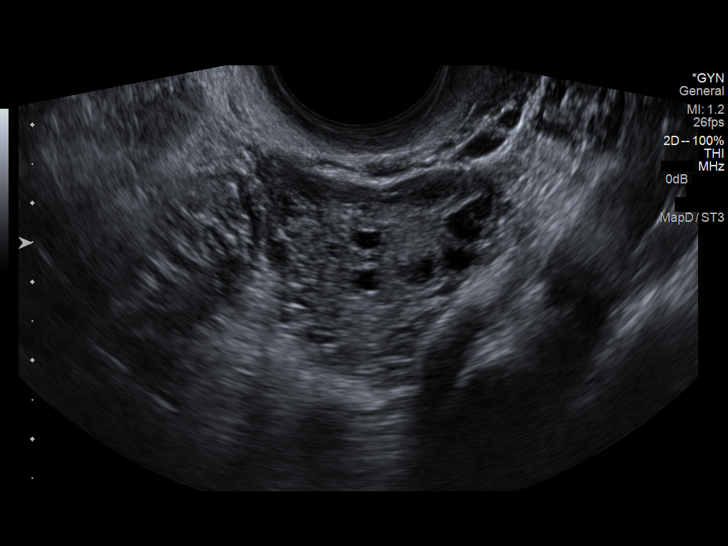
[im 74/81]
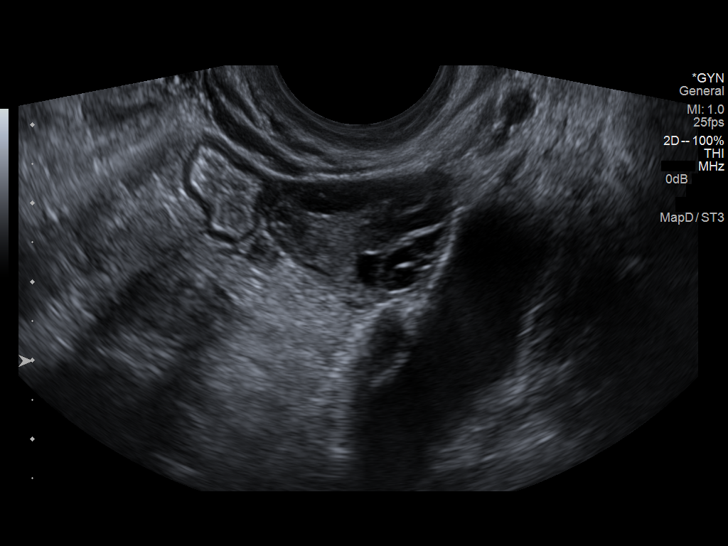
[im 81/81]
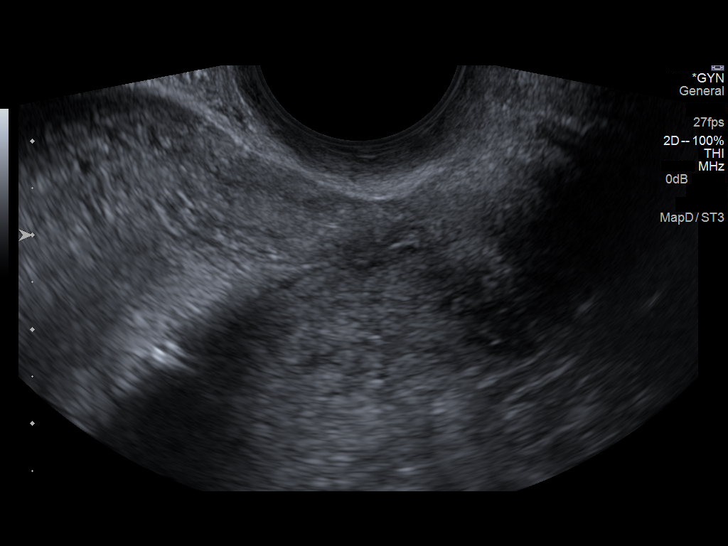

[13 of 25 positions shown; findings below may reference images not displayed]

FINDINGS: Uterus

Measurements: 9.2 x 4.6 x 5.1 cm. No fibroids or other mass
visualized.

Endometrium

Thickness: 3.7 mm. An IUD is present. This appears obliquely
oriented in the endometrial canal, with the lower end of the IUD
potentially extending into the myometrium in the body of the uterus,
best demonstrated on image 51/77.

Right ovary

Measurements: 3.1 x 2.1 x 2.4 cm. Normal appearance/no adnexal mass.
Multiple small follicles.

Left ovary

Measurements: 3.2 x 3.2 x 2.6 cm. Normal appearance/no adnexal mass.
Multiple small follicles.

Other findings

Trace volume of free fluid in the cul-de-sac.
IMPRESSION: 1. There is an IUD in position, however, IUD appears obliquely
oriented such that the lower end of the device may be imbedded
within the myometrium in the anterior aspect of the uterine body.
Clinical correlation is recommended.
2. Trace volume of free fluid in the cul-de-sac is presumably
physiologic in this young female patient.
3. No other potential acute findings identified.
